# Patient Record
Sex: Female | Born: 1960 | Race: White | Hispanic: No | Marital: Married
Health system: Southern US, Community
[De-identification: ages and names within clinical notes are randomized; demographics above are authoritative.]

---

## 2011-12-22 ENCOUNTER — Ambulatory Visit: Payer: Self-pay | Admitting: Internal Medicine

## 2011-12-24 LAB — BETA STREP CULTURE(ARMC)

## 2012-04-01 ENCOUNTER — Ambulatory Visit: Payer: Self-pay | Admitting: Internal Medicine

## 2012-05-13 ENCOUNTER — Ambulatory Visit: Payer: Self-pay | Admitting: Emergency Medicine

## 2012-09-19 ENCOUNTER — Ambulatory Visit: Payer: Self-pay | Admitting: Family Medicine

## 2013-03-22 ENCOUNTER — Ambulatory Visit: Payer: Self-pay | Admitting: Surgery

## 2013-04-05 ENCOUNTER — Ambulatory Visit: Payer: Self-pay | Admitting: Otolaryngology

## 2013-05-03 ENCOUNTER — Ambulatory Visit: Payer: Self-pay | Admitting: Otolaryngology

## 2013-05-04 LAB — PATHOLOGY REPORT

## 2013-12-10 IMAGING — US US BREAST BILAT
1 series · 13 of 25 positions shown · non-contrast
Comparison: none

REASON FOR EXAM: RT BR MASS NIPPLE AREA LT BR MASS QPNOPNS
COMMENTS:

PROCEDURE:     US  - US BREAST BILATERAL  - September 19, 2012  [DATE]
RESULT:

[Series 1: us breast bilat · 0.08mm/px · 13 of 57 slices shown]
[im 1/57]
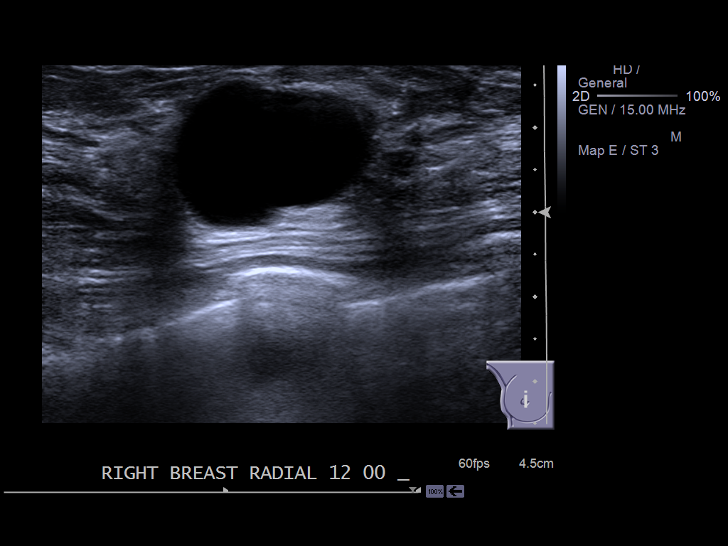
[im 5/57]
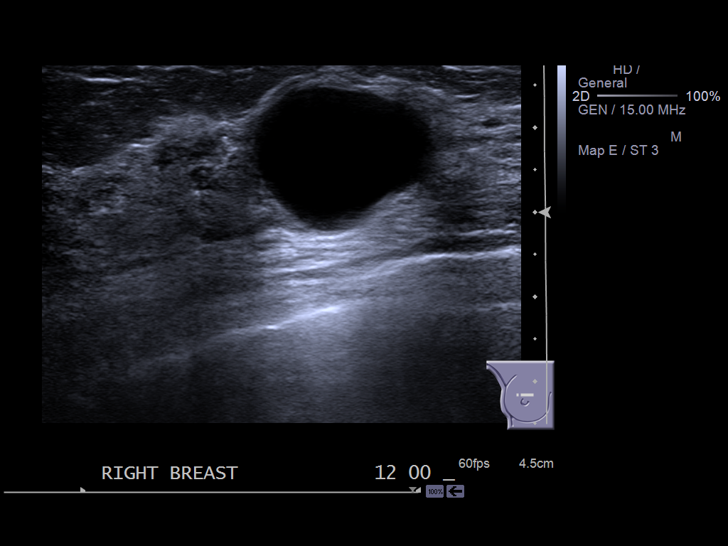
[im 10/57]
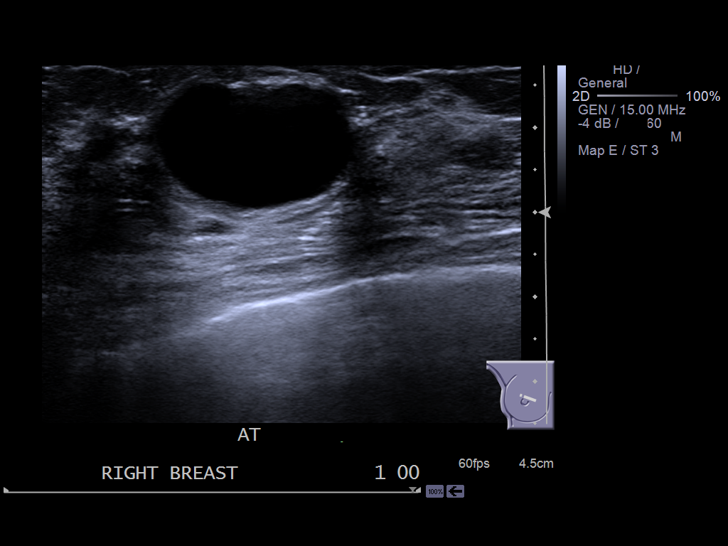
[im 15/57]
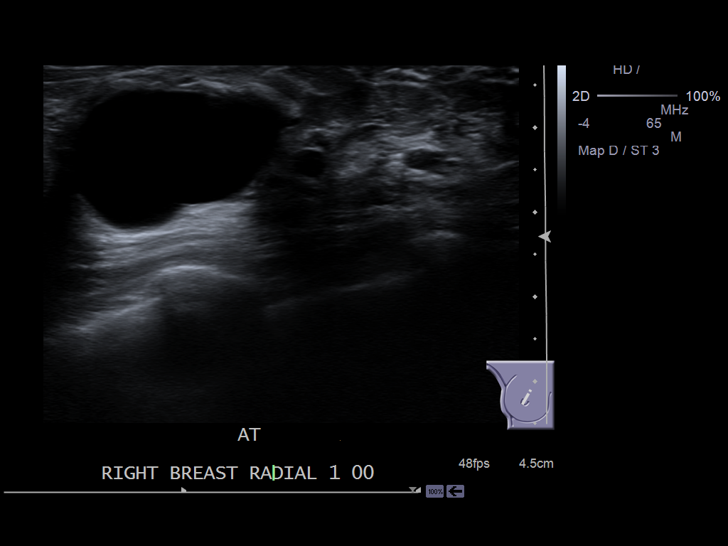
[im 19/57]
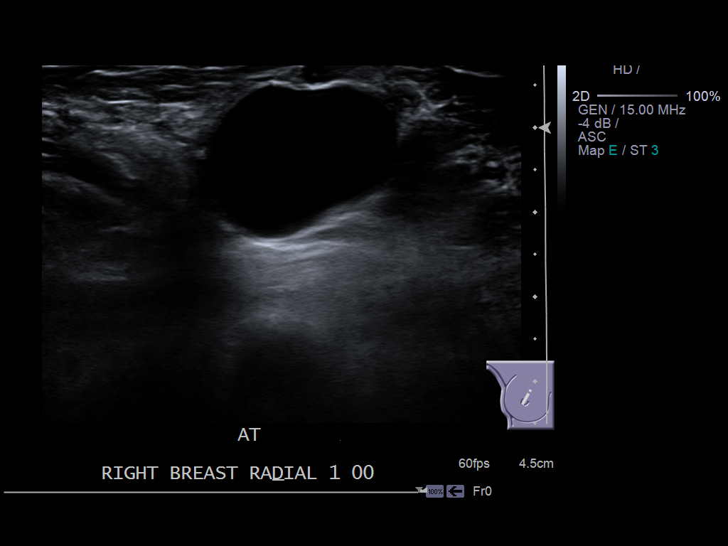
[im 24/57]
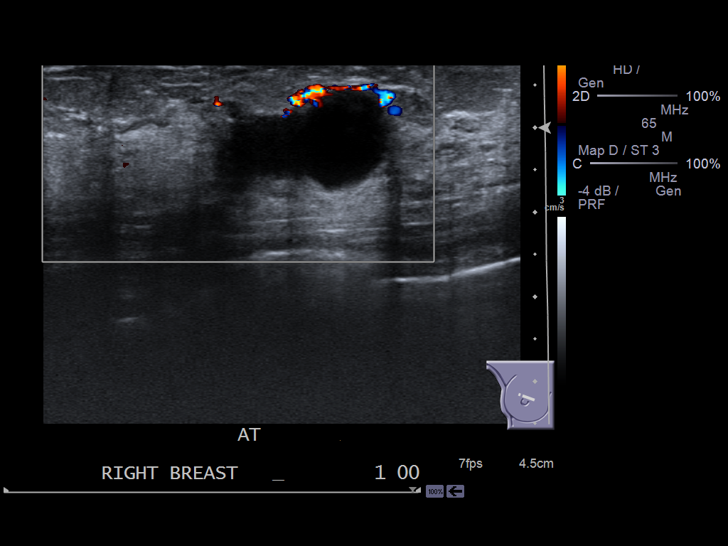
[im 29/57]
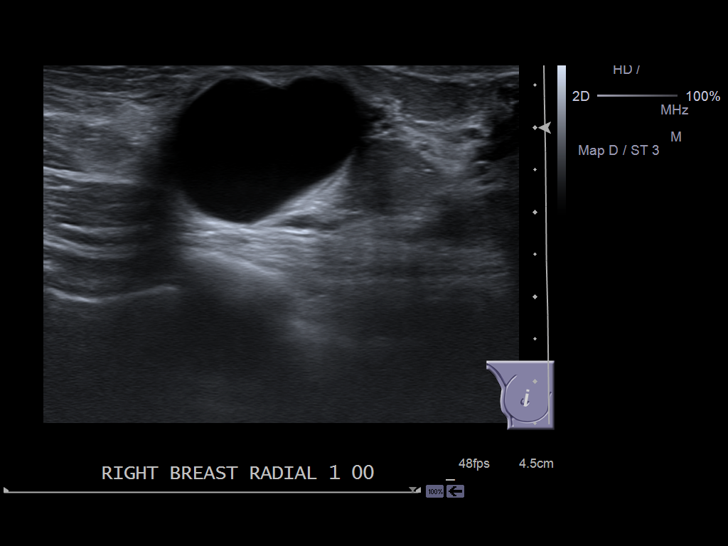
[im 33/57]
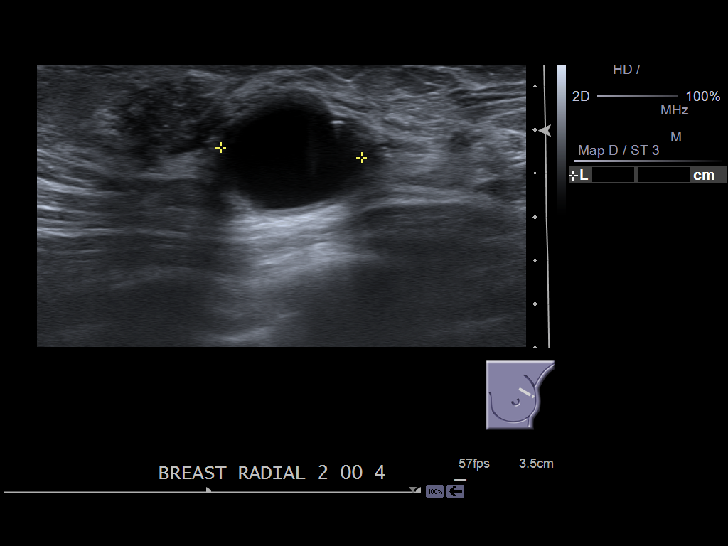
[im 38/57]
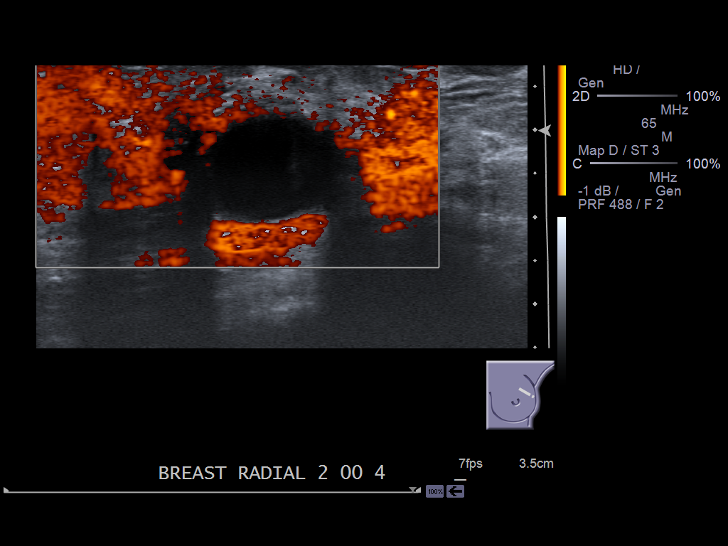
[im 43/57]
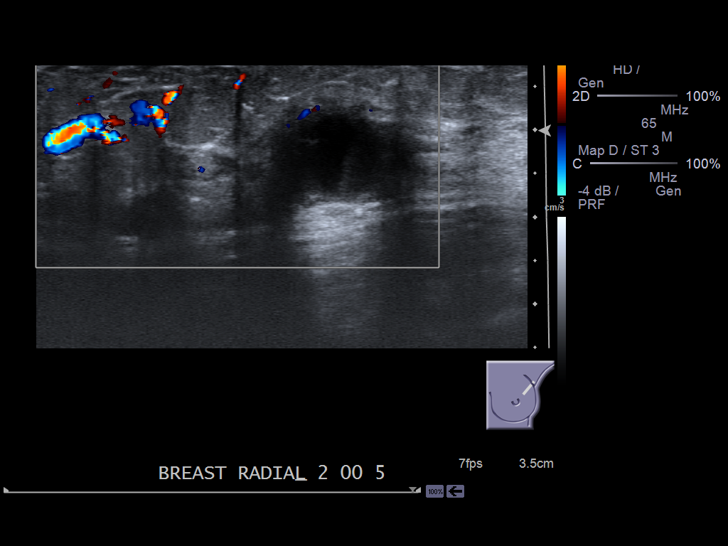
[im 47/57]
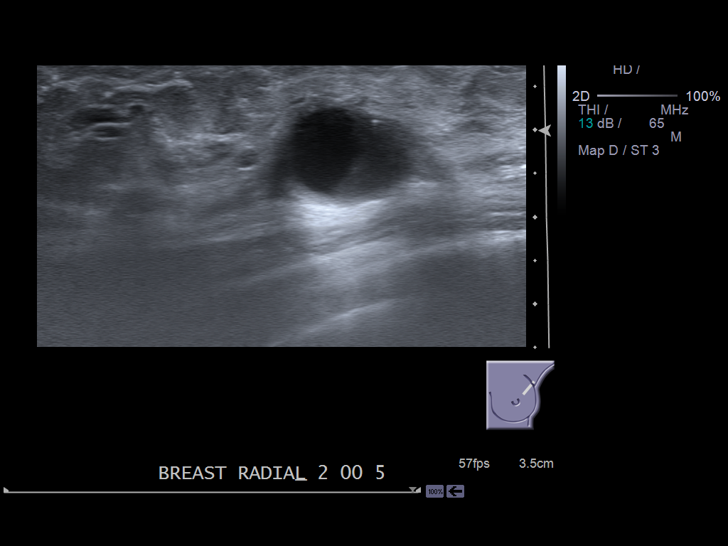
[im 52/57]
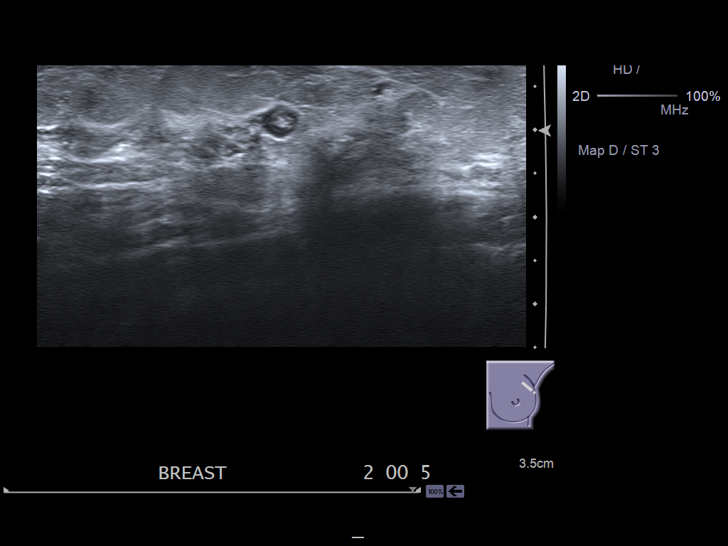
[im 57/57]
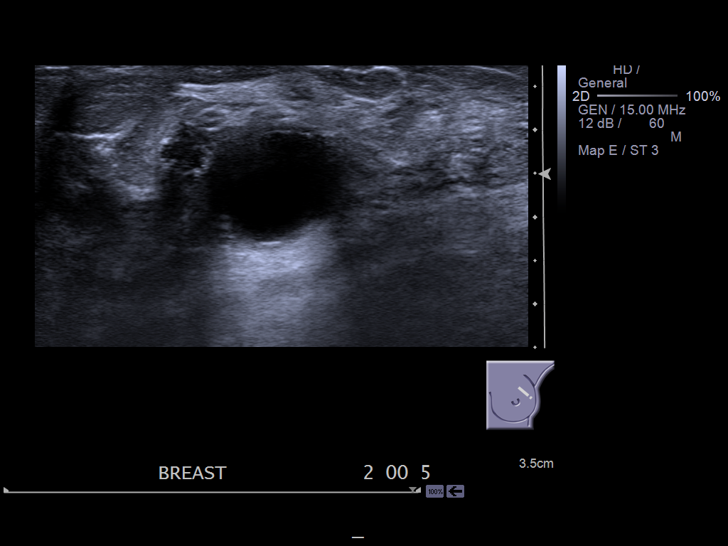

[13 of 25 positions shown; findings below may reference images not displayed]

FINDINGS: Evaluation of the left breast demonstrates a 4 cm hypoechoic
lesion with a thickened appearing wall at the 2 o'clock position. There are
internal echoes appreciated within the nodule as well as a punctate area of
calcification along the periphery. This area measures 1.7 x 1.26 x
centimeters. There is no sonographic evidence of vascularity appreciated. A
small benign appearing lymph node is also identified at the 2 o'clock
position approximately 5 cm from the nipple within the left breast.

Evaluation of the right breast in the region of clinical concern
demonstrates a cystic appearing nodule with anechoic internal echoes
measuring 2.36 x 1.75 x 2.78 centimeters and is appreciated at the 1 o'clock
position approximately 2 cm deep to the nipple. This area appears to be
anechoic with increased through transmission and imperceptible wall and
absence of vascularity. Two smaller daughter cysts are also identified
within the region. There is no evidence of internal vascularity. This area
measures 2.36 x 1.75 x 2.78 cm.
IMPRESSION: 1. Indeterminate cystic nodule within the left breast.
2. The region of interest within the right breast appears to be consistent
with a benign-appearing cyst. Please refer to the bilateral digital
diagnostic mammogram for completed discussion.

## 2014-06-04 ENCOUNTER — Ambulatory Visit: Payer: Self-pay | Admitting: Otolaryngology

## 2014-06-12 IMAGING — US ULTRASOUND LEFT BREAST
1 series · 14 of 25 positions shown · non-contrast
Comparison: none

REASON FOR EXAM: LT BRST BX AND MASS FU
COMMENTS:

PROCEDURE:     US  - US BREAST LEFT  - March 22, 2013 [DATE]
RESULT:     Focused left breast ultrasound dated 03/22/2013.

[Series 1: ultrasound left breast · 0.08mm/px · 14 of 33 slices shown]
[im 1/33]
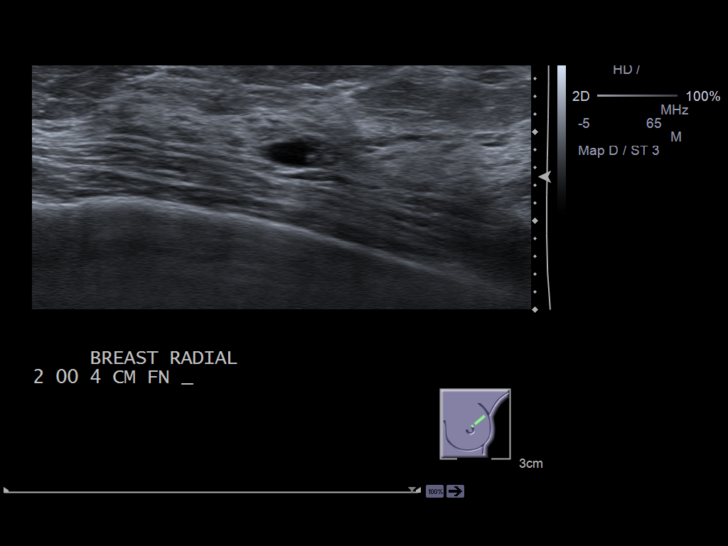
[im 3/33]
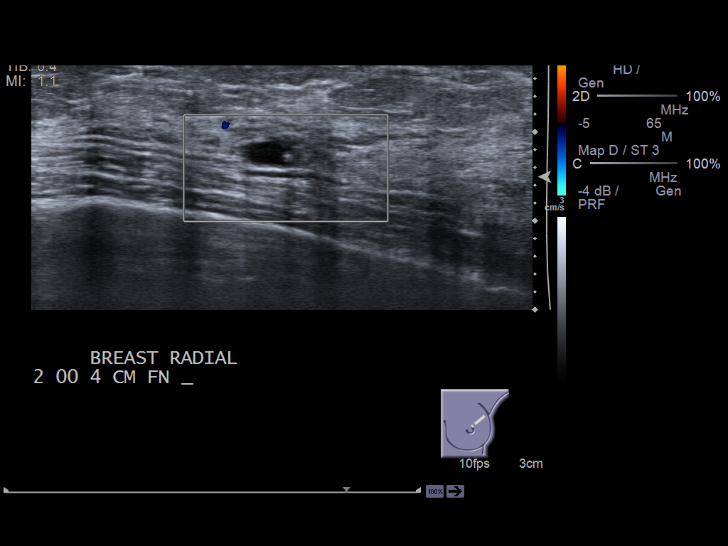
[im 6/33]
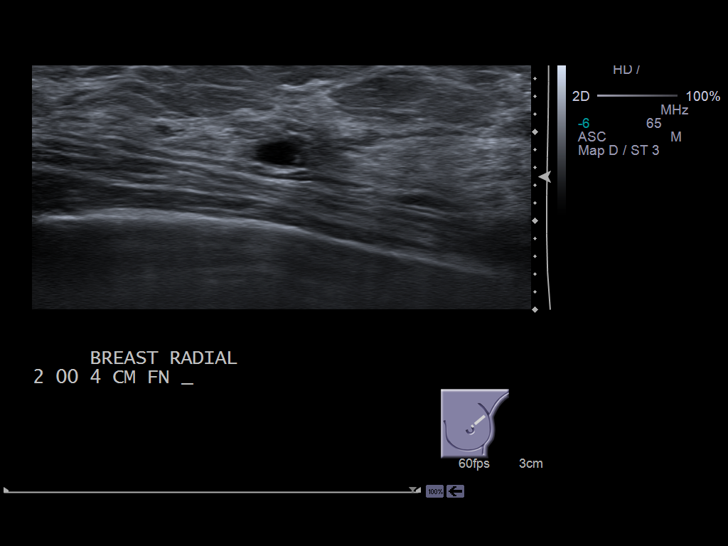
[im 9/33]
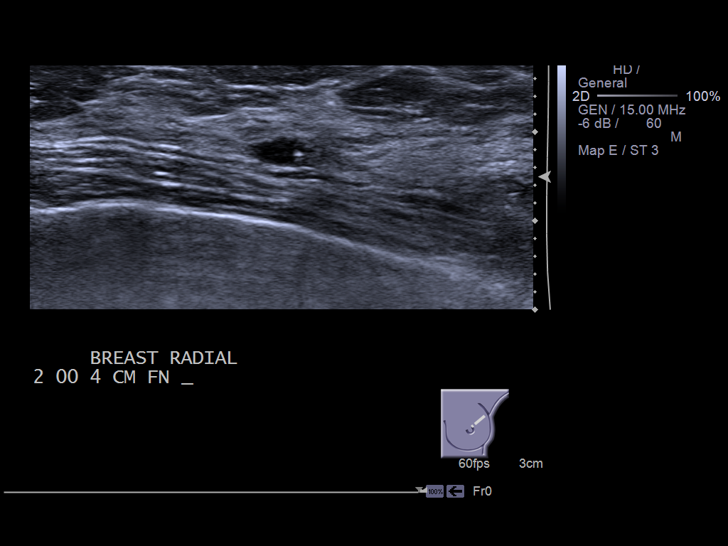
[im 11/33]
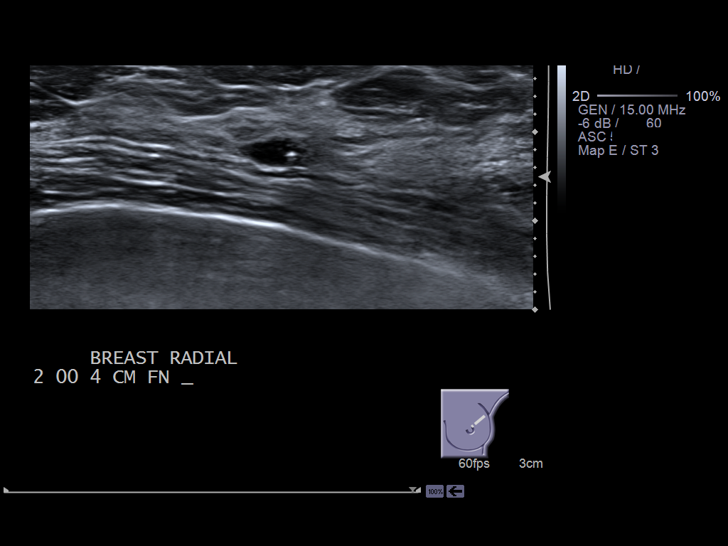
[im 13/33]
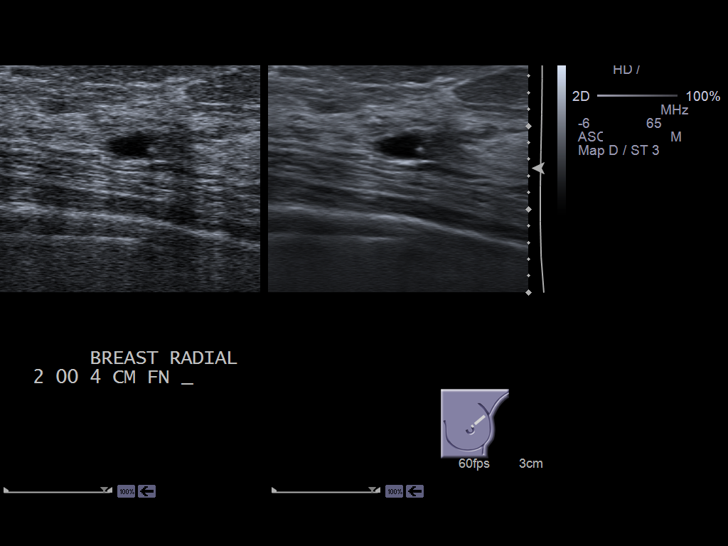
[im 15/33]
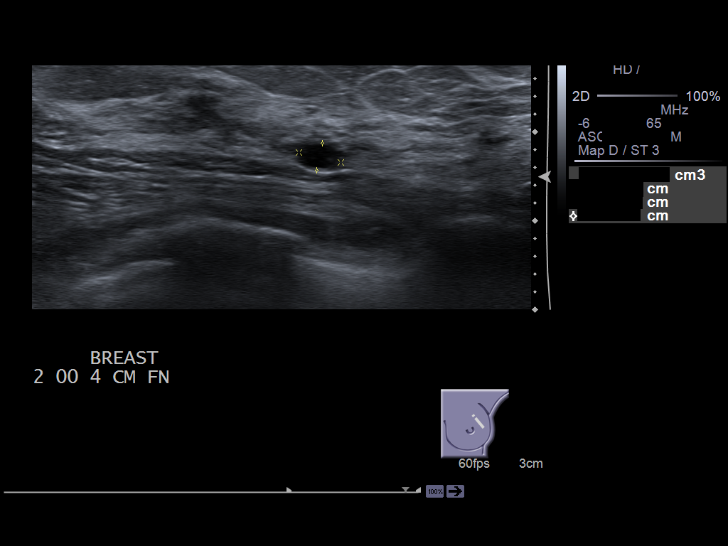
[im 18/33]
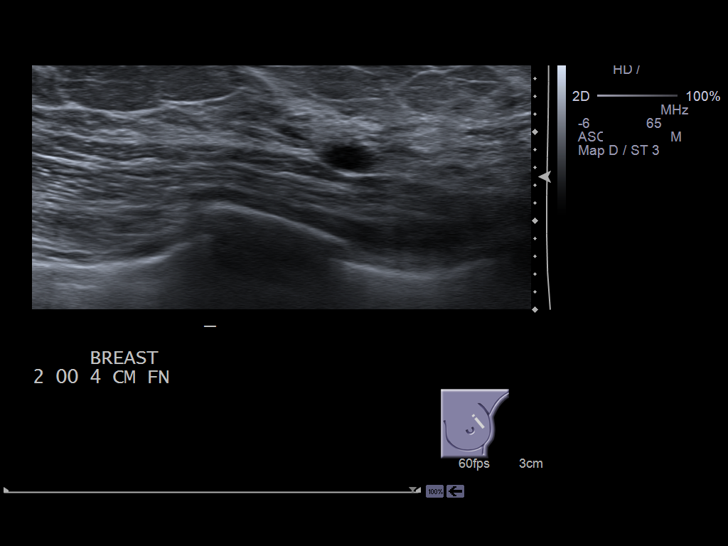
[im 21/33]
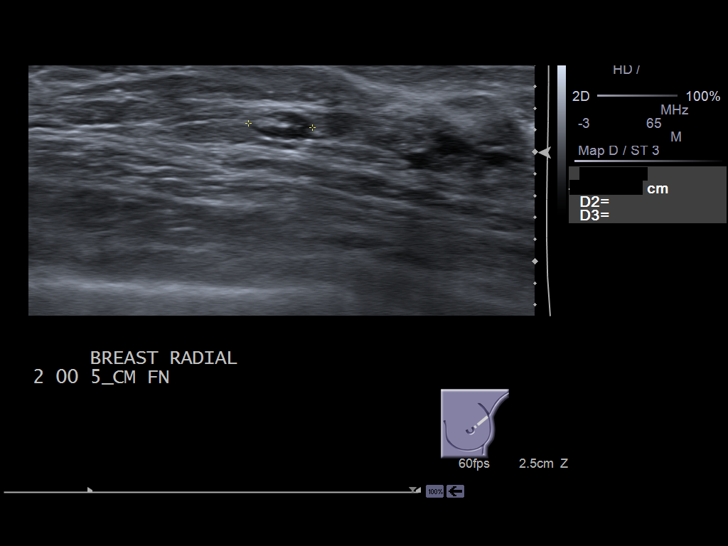
[im 22/33]
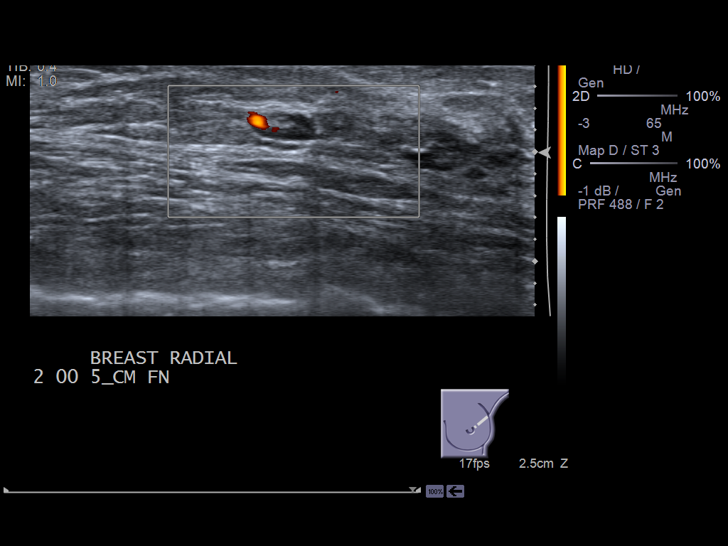
[im 25/33]
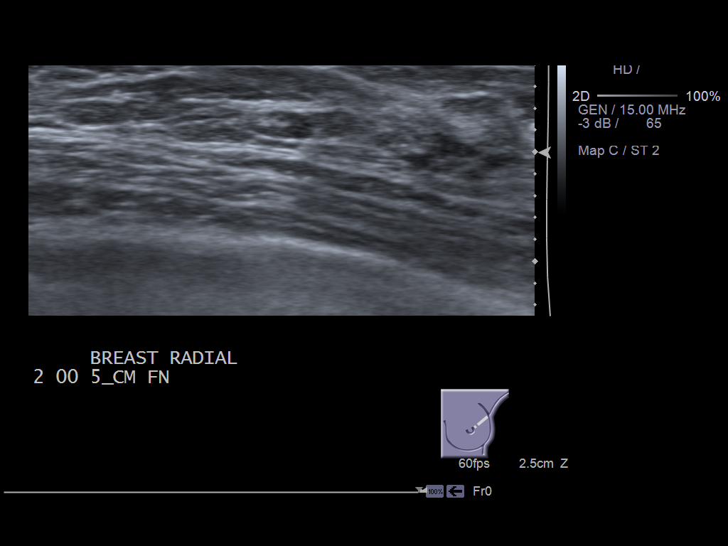
[im 27/33]
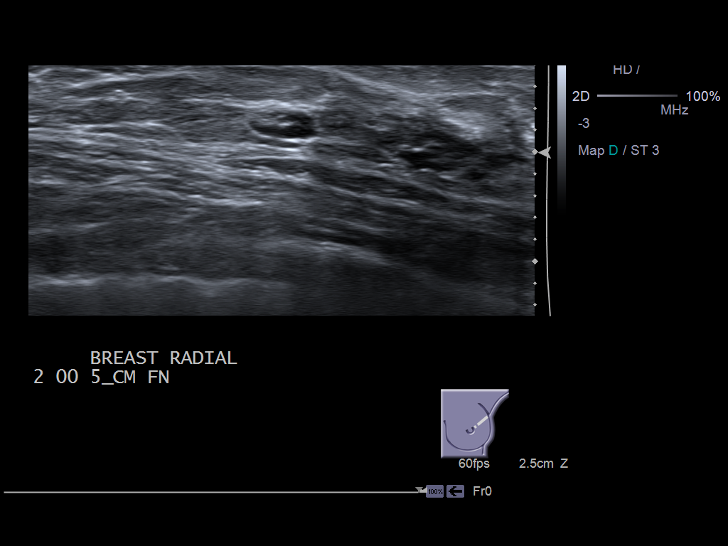
[im 30/33]
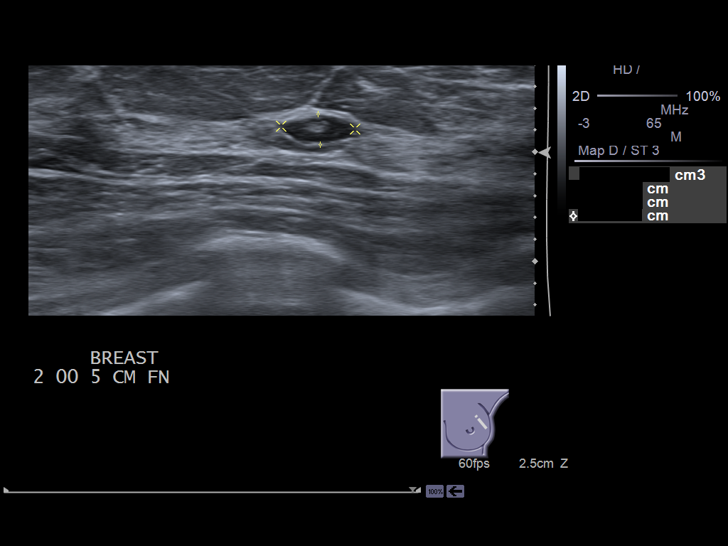
[im 33/33]
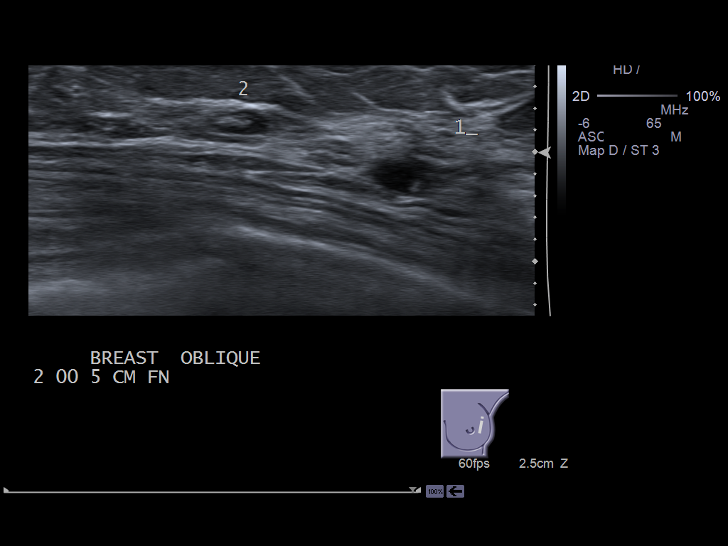

[14 of 25 positions shown; findings below may reference images not displayed]

FINDINGS: Previous described cystic nodule at the [DATE] position within the
right breast approximately 5 cm from the nipple is decreased in size when
correlated with a previous ultrasound report dated 09/19/2012. The nodule
measures 0.88 x 0.48 x 0.31 cm producing measuring 1.7 x 1.261 0.62 cm.
There is no evidence of vascularity within the nodule. A small peripheral
calcification is identified like reflecting a benign mural calcification.
Six month reevaluation with ultrasound is recommended to insure resolution
and/or stability. A benign-appearing lymph nodes also identified within the
left breast measuring 0.59 x 0.68 x 0.28 cm.
IMPRESSION: Significantly decreased in size of the hypoechoic likely
benign cystic nodule in the left breast. A peripheral area of calcifications
identified likely mural calcification. Six month reevaluation with
ultrasound is recommended. Please refer to the unilateral left breast
mammogram for complete discussion.

## 2015-01-25 NOTE — Op Note (Signed)
PATIENT NAME:  Jacqueline Walton, Jacqueline Walton MR#:  [id number] DATE OF BIRTH:  [birth year]  DATE OF PROCEDURE:  05/03/2013  PREOPERATIVE DIAGNOSIS: Left parotid mass.   POSTOPERATIVE DIAGNOSIS: Left parotid mass.   PROCEDURE: Left superficial parotidectomy with facial nerve monitoring.    SURGEON: Marion DownerScott Armani Gawlik, MD   ANESTHESIA: General endotracheal.   INDICATIONS: This is a 54 year old female with a vague mass in the left tail of the parotid. The mass did not show up well on CT scan but an indistinct nodule was noted on review of the scan. Needle aspirate showed benign salivary cells and abundant lymphocytes.   FINDINGS: The parotid tissue was very nodular with small lymph nodes within the gland in this region. It is possible the palpable nodule was most likely one of the lymph nodes within the gland itself.   COMPLICATIONS: None.   DESCRIPTION OF PROCEDURE: After obtaining informed consent, the patient was taken to the operating room and placed in the supine position. After induction of general endotracheal anesthesia, the patient was turned 90 degrees and placed on a shoulder roll. 1% lidocaine with epinephrine 1:100,000 was injected in the skin over the proposed incision site. Facial nerve monitor electrodes were placed, hooked up and proper functioning confirmed. The left neck and face were then prepped and draped in the usual sterile fashion. A 15 blade was used to incise the skin just in front of the ear and curving down below the earlobe into the neck in an S-shaped facelift type incision. The incision was carried down to the subcutaneous tissues. The flap was raised over the parotid fascia and the dissection was carried down to the sternocleidomastoid muscle inferiorly. The parotid tissue was then dissected away from the tragal cartilage, mastoid and the sternocleidomastoid muscle. The digastric muscle was identified inferiorly. Dissection proceeded down to the trunk of the facial nerve which was  confirmed with the nerve stimulator. The intervening parotid tissue was then divided and dissection proceeded out along the branches of the parotid gland, including inferior, middle and inferior branches. Intervening parotid tissue were divided with either the bipolar cautery or the Harmonic scalpel taking care to monitor for any facial movement as well as listening carefully to the facial nerve monitor. There was no movement during any of the points of dividing tissue. This process was continued dividing small vessels with the Harmonic scalpel until the parotid tissue with the area of nodularity was adequately dissected out. The parotid was then divided anterior to the area of nodularity with the nerve in full view. The nerve branches were stimulated with good movement noted. The wound was irrigated with saline and closed after placing a #10 TLS drain. 4-0 Vicryl suture was used for the deep closure followed by 5-0 Prolene in a running lock stitch for the skin. The drain was secured with a 5-0 Prolene suture and the drain hooked up to Hemovac tube suction. The patient was then returned to the anesthesiologist for awakening. Bacitracin was applied to her wound. She was awakened and taken to recovery room in good condition postoperatively. Blood loss was less than 50 mL.   ____________________________ Ollen GrossPaul S. Willeen CassBennett, MD psb:dp D: 05/03/2013 13:58:43 ET T: 05/03/2013 14:38:02 ET JOB#: [id number]  cc: Ollen GrossPaul S. Willeen CassBennett, MD, <Dictator> Sandi MealyPAUL S Karli Wickizer MD ELECTRONICALLY SIGNED 05/27/2013 11:46

## 2015-08-25 IMAGING — CT CT CERVICAL SPINE WITHOUT CONTRAST
3 series · 15 of 27 positions shown, 18 images · non-contrast
Comparison: Neck CT with contrast 04/05/2013.

CLINICAL DATA: 53-year-old female with neck pain when looking right
to left. Dysphagia. Initial encounter. History of scoliosis and
spinal rods.

EXAM:
CT CERVICAL SPINE WITHOUT CONTRAST
TECHNIQUE: Multidetector CT imaging of the cervical spine was performed without
intravenous contrast. Multiplanar CT image reconstructions were also
generated.

[Series 3: c spine soft · axial · 0.34mm/px · z∈[-180,-90]mm · 4 of 77 slices shown]
[im 16/77  soft-tissue]
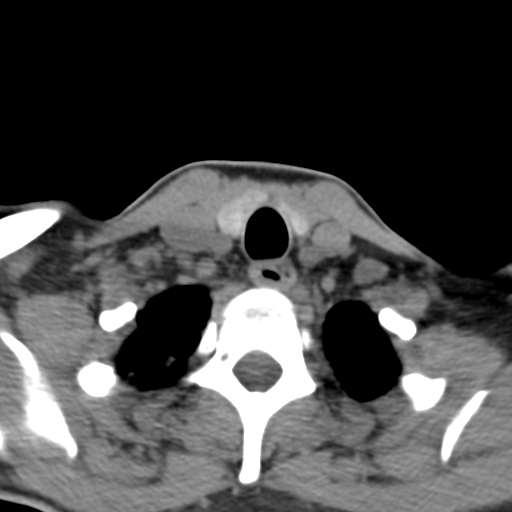
[im 31/77  soft-tissue]
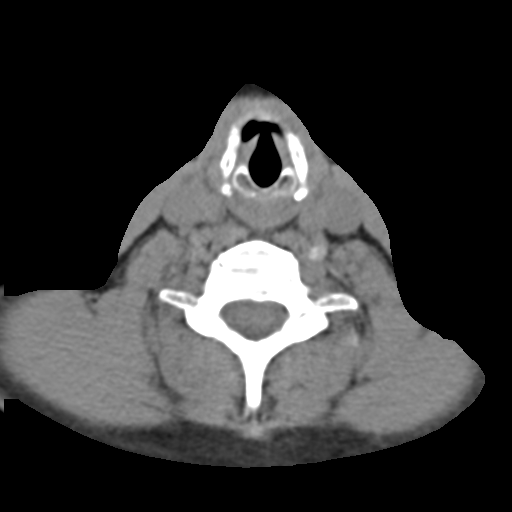
[im 46/77  soft-tissue]
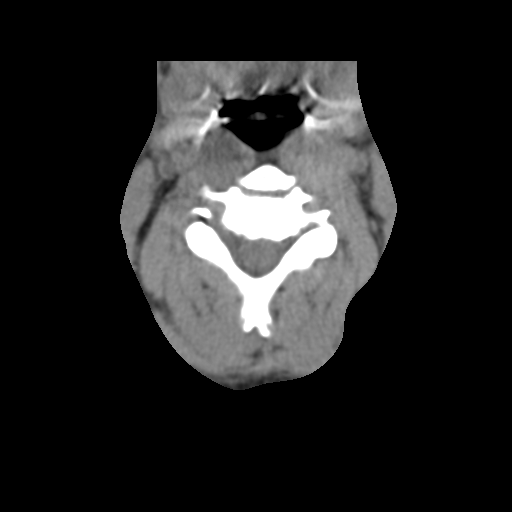
[im 61/77  soft-tissue]
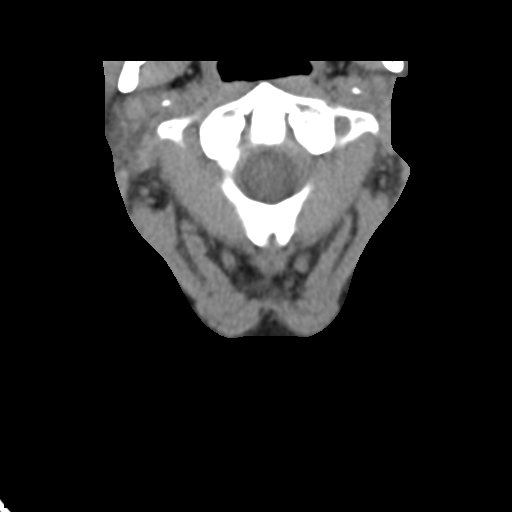

[Series 603: sagittal bone · sagittal · 0.34mm/px · 5 of 53 slices shown, 6 images]
[im 18/53  bone]
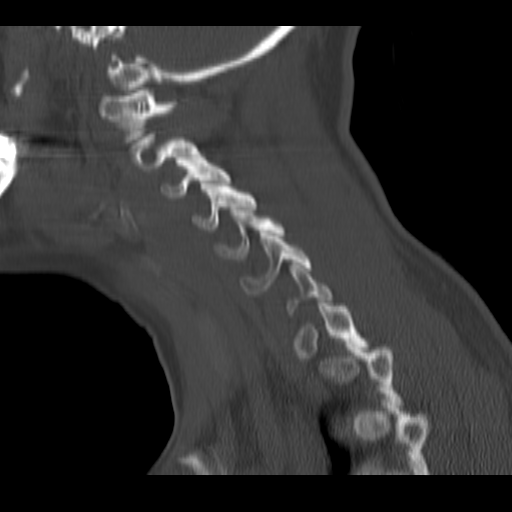
[im 22/53  bone]
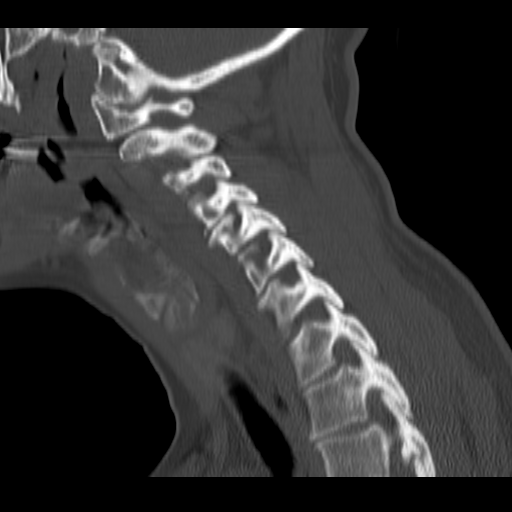
[im 27/53  soft-tissue]
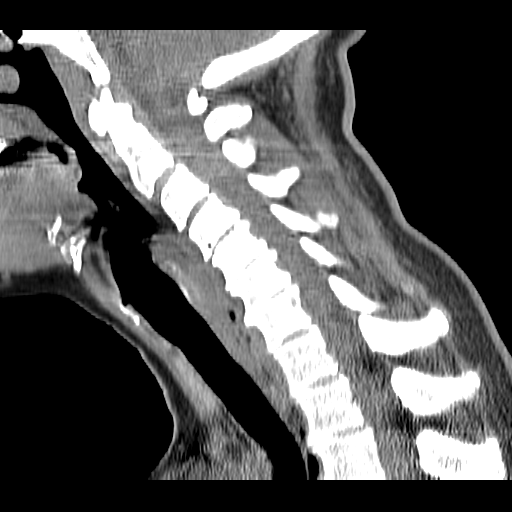
[im 27/53  bone]
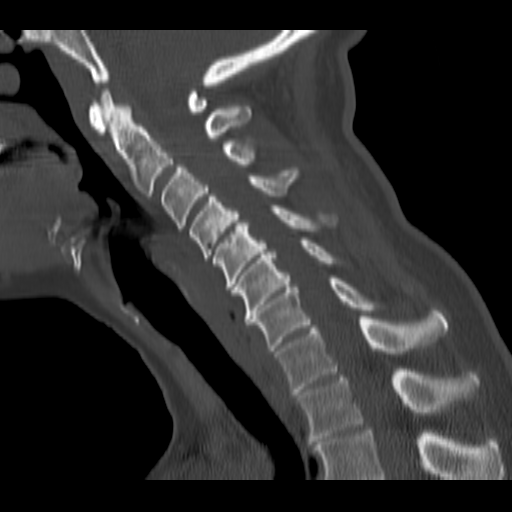
[im 31/53  bone]
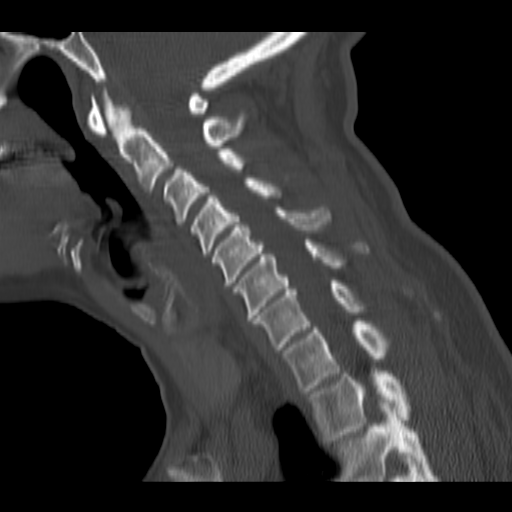
[im 35/53  bone]
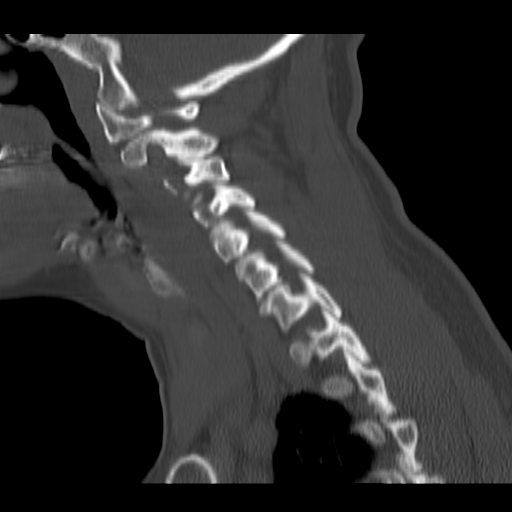

[Series 605: orthoganal axials · axial · 0.34mm/px · z∈[-152,-24]mm · 6 of 104 slices shown, 8 images]
[im 15/104  soft-tissue]
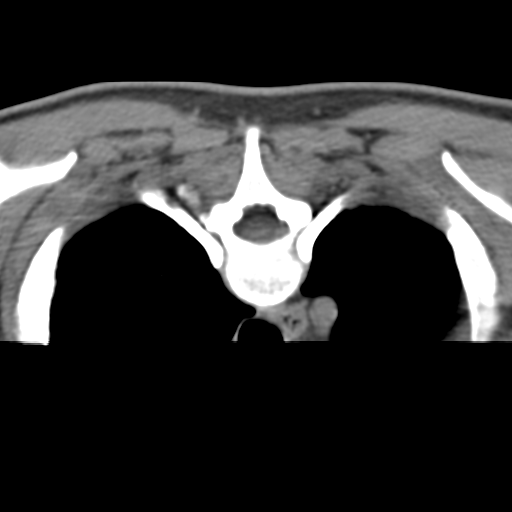
[im 15/104  bone]
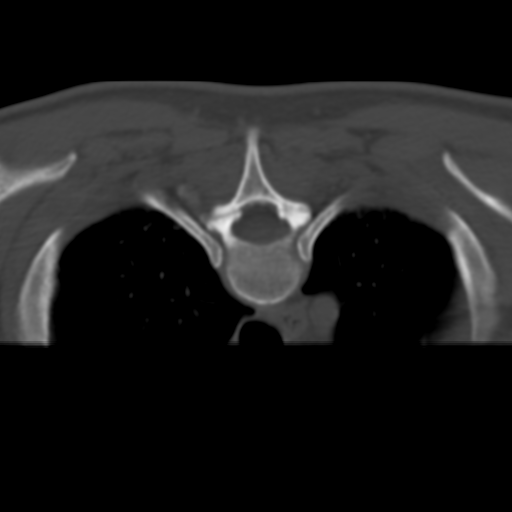
[im 30/104  bone]
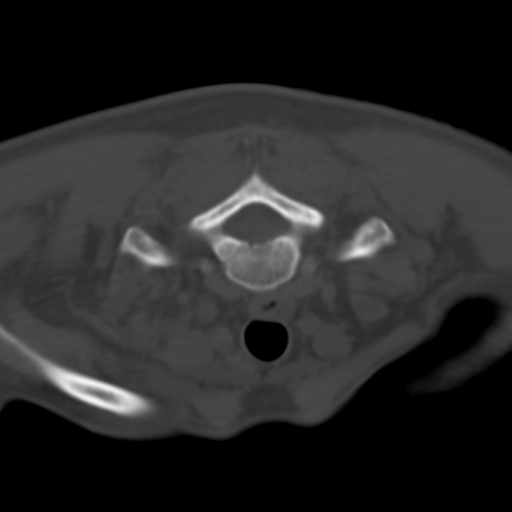
[im 45/104  bone]
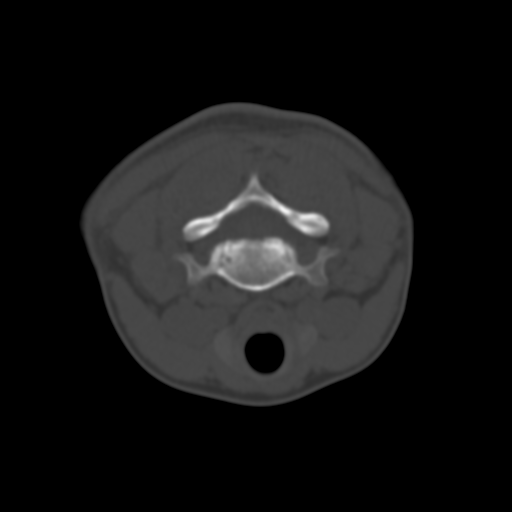
[im 59/104  bone]
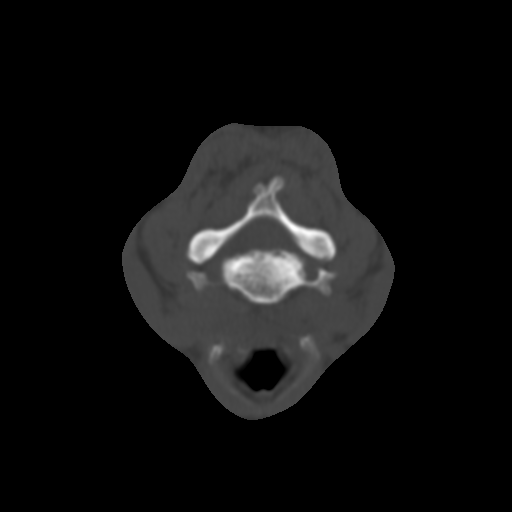
[im 74/104  soft-tissue]
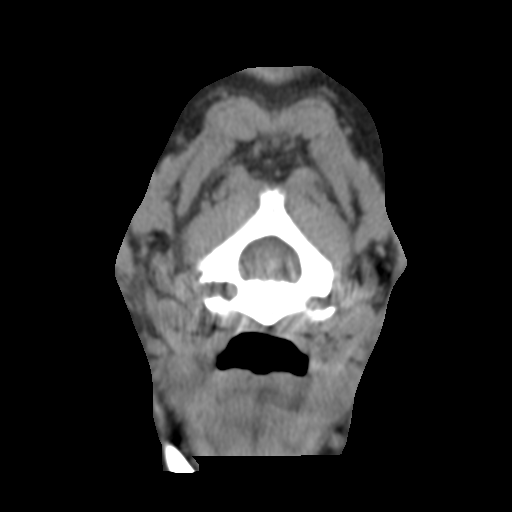
[im 74/104  bone]
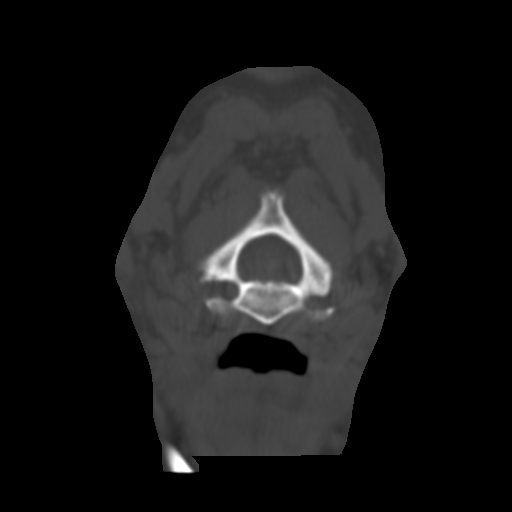
[im 89/104  bone]
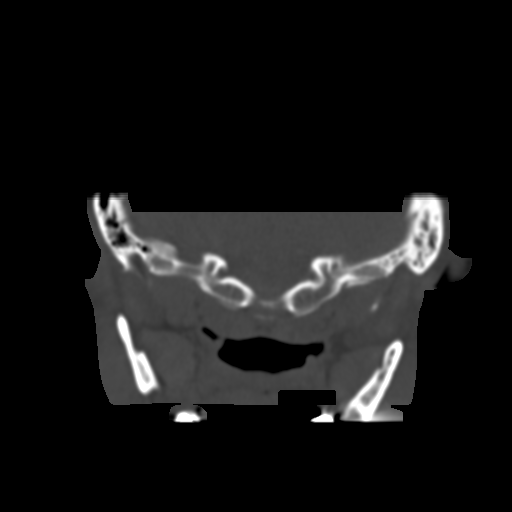

[15 of 27 positions shown; findings below may reference images not displayed]

FINDINGS: Straightening and mild reversal of cervical lordosis. Visualized
skull base is intact. No atlanto-occipital dissociation.
Cervicothoracic junction alignment is within normal limits.
Bilateral posterior element alignment is within normal limits.

Visible paranasal sinuses are clear. Stable mild inferior mastoid
opacification bilaterally.

The motion artifact affecting the soft tissues of the pharynx.
Negative non contrast paraspinal soft tissues otherwise. Negative
visualized posterior fossa structures.

C2-C3:  Negative.

C3-C4: Right greater than left uncovertebral hypertrophy. Small
central to right paracentral disc protrusion. No significant spinal
stenosis suspected. Mild to moderate right C4 foraminal stenosis.

C4-C5: Disc space loss. Left eccentric circumferential disc
osteophyte complex. No significant spinal stenosis suspected. Severe
left and mild to moderate right C5 foraminal stenosis.

C5-C6: Disc space loss. Circumferential disc osteophyte complex. No
significant spinal stenosis. Mild C6 foraminal stenosis greater on
the left.

C6-C7: Disc space loss. Circumferential disc osteophyte complex. No
significant spinal stenosis. Mild right C7 foraminal stenosis.

C7-T1:  Negative.
IMPRESSION: No acute osseous abnormality identified. Multilevel chronic disc and
endplate degeneration in the cervical spine, but no significant
spinal stenosis suspected. There is up to moderate right C4 and
moderate to severe bilateral C5 neural foraminal stenosis.

## 2021-12-28 ENCOUNTER — Telehealth: Payer: Self-pay | Admitting: Nurse Practitioner

## 2021-12-28 DIAGNOSIS — H109 Unspecified conjunctivitis: Secondary | ICD-10-CM

## 2021-12-28 MED ORDER — POLYMYXIN B-TRIMETHOPRIM 10000-0.1 UNIT/ML-% OP SOLN: 1.0000 [drp] | Freq: Four times a day (QID) | OPHTHALMIC | 0 refills | Status: AC

## 2021-12-28 NOTE — Patient Instructions (Addendum)
?  [facility], thank you for joining Abran Cantor, NP for today's virtual visit.  While this provider is not your primary care provider (PCP), if your PCP is located in our provider database this encounter information will be shared with them immediately following your visit. ? ?Consent: ?(Patient) Jacqueline Walton provided verbal consent for this virtual visit at the beginning of the encounter. ? ?Current Medications: ? ?Current Outpatient Medications:  ?  trimethoprim-polymyxin b (POLYTRIM) ophthalmic solution, Place 1 drop into both eyes every 6 (six) hours for 7 days., Disp: 10 mL, Rfl: 0  ? ?Medications ordered in this encounter:  ?Meds ordered this encounter  ?Medications  ? trimethoprim-polymyxin b (POLYTRIM) ophthalmic solution  ?  Sig: Place 1 drop into both eyes every 6 (six) hours for 7 days.  ?  Dispense:  10 mL  ?  Refill:  0  ?  ? ?*If you need refills on other medications prior to your next appointment, please contact your pharmacy* ? ?Follow-Up: ?Call back or seek an in-person evaluation if the symptoms worsen or if the condition fails to improve as anticipated. Refrain from wearing contacts and discarding eye makeup use. Warm compresses to the affected eye. Strict hand hygiene when treating symptoms. Follow-up with PCP if symptoms do not improve.  ? ? ?Other Instructions ?Use medication as prescribed.  ? ?If you have been instructed to have an in-person evaluation today at a local Urgent Care facility, please use the link below. It will take you to a list of all of our available [facility], including address, phone number and hours of operation. Please do not delay care.  ?[facility] ? ?If you or a family member do not have a primary care provider, use the link below to schedule a visit and establish care. When you choose a [facility] primary care physician or advanced practice provider, you gain a long-term partner in health. ?Find a Primary Care  Provider ? ?Learn more about [facility]'s in-office and virtual care options: ?[facility] - Get Care Now ? ?

## 2021-12-28 NOTE — Progress Notes (Addendum)
?Virtual Visit Consent  ? ?[name], you are scheduled for a virtual visit with a  provider today.   ?  ?Just as with appointments in the office, your consent must be obtained to participate.  Your consent will be active for this visit and any virtual visit you may have with one of our providers in the next 365 days.   ?  ?If you have a MyChart account, a copy of this consent can be sent to you electronically.  All virtual visits are billed to your insurance company just like a traditional visit in the office.   ? ?As this is a virtual visit, video technology does not allow for your provider to perform a traditional examination.  This may limit your provider's ability to fully assess your condition.  If your provider identifies any concerns that need to be evaluated in person or the need to arrange testing (such as labs, EKG, etc.), we will make arrangements to do so.   ?  ?Although advances in technology are sophisticated, we cannot ensure that it will always work on either your end or our end.  If the connection with a video visit is poor, the visit may have to be switched to a telephone visit.  With either a video or telephone visit, we are not always able to ensure that we have a secure connection.    ? ?I need to obtain your verbal consent now.   Are you willing to proceed with your visit today?  ?  ?Jacqueline Walton has provided verbal consent on 12/28/2021 for a virtual visit (video or telephone). ?  ?Abran Cantor, NP  ? ?Date: 12/28/2021 11:32 AM ? ? ?Virtual Visit via Video Note  ? ?Jacqueline Walton, connected with  Jacqueline Walton  ([id number], 07-Jul-1961) on 12/28/21 at 11:15 AM EDT by a video-enabled telemedicine application and verified that I am speaking with the correct person using two identifiers. ? ?Location: ?Patient: Virtual Visit Location Patient: Home ?Provider: Virtual Visit Location Provider: Home ?  ?I discussed the limitations of evaluation and management  by telemedicine and the availability of in person appointments. The patient expressed understanding and agreed to proceed.   ? ?History of Present Illness: ?Jacqueline Walton is a 61 y.o. who identifies as a female who was assigned female at birth, and is being seen today for suspected pink eye. Symptoms started 2 days ago in the right eye. She complains of matting, drainage, tearing, redness. She denies pain in the right eye or change in her vision. 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 Wears contacts.  ? ?HPI: HPI  ?Problems: There are no problems to display for this patient. ?  ?Allergies: Not on File ?Medications:  ?Current Outpatient Medications:  ?  trimethoprim-polymyxin b (POLYTRIM) ophthalmic solution, Place 1 drop into both eyes every 6 (six) hours for 7 days., Disp: 10 mL, Rfl: 0 ? ?Observations/Objective: ?Patient is well-developed, well-nourished in no acute distress.  ?Resting comfortably at home.  ?Head is normocephalic, atraumatic.  ?No labored breathing.  ?Speech is clear and coherent with logical content.  ?Patient is alert and oriented at baseline.  ? ? ?Assessment and Plan: ?1. Bacterial conjunctivitis of right eye ?- trimethoprim-polymyxin b (POLYTRIM) ophthalmic solution; Place 1 drop into both eyes every 6 (six) hours for 7 days.  Dispense: 10 mL; Refill: 0 ? ?Patient presents for symptoms of conjunctivitis for 2 days. Complains  of redness, drainage, tearing and irritation in the right eye. Denies fever, chills, change in vision or loss of vision. Will treat with abx for bacterial conjunctivitis. Patient informed to refrain from wearing contacts and discarding eye makeup use. Warm compresses to the affected eye. Strict hand hygiene when treating symptoms. Follow-up with PCP if symptoms do not improve.  ? ?Follow Up Instructions: ?I discussed the assessment and treatment plan with the patient. The patient was provided an opportunity to ask  questions and all were answered. The patient agreed with the plan and demonstrated an understanding of the instructions.  A copy of instructions were sent to the patient via MyChart unless otherwise noted below.  ? ? ?The patient was advised to call back or seek an in-person evaluation if the symptoms worsen or if the condition fails to improve as anticipated. ? ?Time:  ?I spent 10 minutes with the patient via telehealth technology discussing the above problems/concerns.   ? ?Abran Cantor, NP ?
